# Patient Record
Sex: Female | Born: 1990 | Race: White | Hispanic: No | Marital: Single | Smoking: Never smoker
Health system: Southern US, Community
[De-identification: ages and names within clinical notes are randomized; demographics above are authoritative.]

## PROBLEM LIST (undated history)

## (undated) DIAGNOSIS — S060XAA Concussion with loss of consciousness status unknown, initial encounter: Secondary | ICD-10-CM

## (undated) DIAGNOSIS — D759 Disease of blood and blood-forming organs, unspecified: Secondary | ICD-10-CM

## (undated) DIAGNOSIS — S065XAA Traumatic subdural hemorrhage with loss of consciousness status unknown, initial encounter: Secondary | ICD-10-CM

## (undated) DIAGNOSIS — S065X9A Traumatic subdural hemorrhage with loss of consciousness of unspecified duration, initial encounter: Secondary | ICD-10-CM

## (undated) DIAGNOSIS — T7840XA Allergy, unspecified, initial encounter: Secondary | ICD-10-CM

## (undated) DIAGNOSIS — Z5189 Encounter for other specified aftercare: Secondary | ICD-10-CM

## (undated) DIAGNOSIS — D649 Anemia, unspecified: Secondary | ICD-10-CM

## (undated) DIAGNOSIS — B019 Varicella without complication: Secondary | ICD-10-CM

## (undated) DIAGNOSIS — S060X9A Concussion with loss of consciousness of unspecified duration, initial encounter: Secondary | ICD-10-CM

## (undated) DIAGNOSIS — N920 Excessive and frequent menstruation with regular cycle: Secondary | ICD-10-CM

## (undated) HISTORY — DX: Traumatic subdural hemorrhage with loss of consciousness status unknown, initial encounter: S06.5XAA

## (undated) HISTORY — DX: Allergy, unspecified, initial encounter: T78.40XA

## (undated) HISTORY — DX: Varicella without complication: B01.9

## (undated) HISTORY — PX: DILATION AND CURETTAGE OF UTERUS: SHX78

## (undated) HISTORY — DX: Disease of blood and blood-forming organs, unspecified: D75.9

## (undated) HISTORY — DX: Traumatic subdural hemorrhage with loss of consciousness of unspecified duration, initial encounter: S06.5X9A

## (undated) HISTORY — DX: Anemia, unspecified: D64.9

## (undated) HISTORY — DX: Concussion with loss of consciousness status unknown, initial encounter: S06.0XAA

## (undated) HISTORY — DX: Concussion with loss of consciousness of unspecified duration, initial encounter: S06.0X9A

## (undated) HISTORY — DX: Encounter for other specified aftercare: Z51.89

## (undated) HISTORY — DX: Excessive and frequent menstruation with regular cycle: N92.0

---
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
## 2012-07-19 ENCOUNTER — Telehealth: Payer: Self-pay

## 2012-07-19 MED ORDER — NORGESTIMATE-ETH ESTRADIOL 0.25-35 MG-MCG PO TABS: 1.0000 | ORAL_TABLET | Freq: Every day | ORAL | Status: DC

## 2012-07-19 NOTE — Telephone Encounter (Signed)
Spoke with pt rgd rs refill request pt has appt 08/02/12 9:45 with ND no missed pills informed rx sent to pharm per protocol pt voice understanding

## 2012-07-19 NOTE — Telephone Encounter (Signed)
Lm on vm tcb rgd rx refill request

## 2012-08-13 ENCOUNTER — Encounter: Payer: Self-pay | Admitting: Obstetrics and Gynecology

## 2012-08-14 ENCOUNTER — Other Ambulatory Visit: Payer: Self-pay | Admitting: Obstetrics and Gynecology

## 2012-08-16 ENCOUNTER — Ambulatory Visit: Payer: Self-pay | Admitting: Obstetrics and Gynecology

## 2012-09-12 ENCOUNTER — Other Ambulatory Visit: Payer: Self-pay | Admitting: Obstetrics and Gynecology

## 2012-10-12 ENCOUNTER — Ambulatory Visit: Payer: Self-pay | Admitting: Obstetrics and Gynecology

## 2012-10-18 ENCOUNTER — Ambulatory Visit: Payer: Self-pay | Admitting: Obstetrics and Gynecology

## 2012-10-30 ENCOUNTER — Other Ambulatory Visit: Payer: Self-pay | Admitting: Obstetrics and Gynecology

## 2014-09-18 ENCOUNTER — Other Ambulatory Visit: Payer: Self-pay

## 2014-09-18 ENCOUNTER — Other Ambulatory Visit (HOSPITAL_COMMUNITY)
Admission: RE | Admit: 2014-09-18 | Discharge: 2014-09-18 | Disposition: A | Discharge status: Home / Self Care | Payer: BC Managed Care – PPO | Source: Ambulatory Visit | Attending: Family Medicine | Admitting: Family Medicine

## 2014-09-18 DIAGNOSIS — Z0000 Encounter for general adult medical examination without abnormal findings: Secondary | ICD-10-CM | POA: Insufficient documentation

## 2014-09-20 LAB — CYTOLOGY - PAP

## 2015-09-01 ENCOUNTER — Emergency Department (HOSPITAL_COMMUNITY)
Admission: EM | Admit: 2015-09-01 | Discharge: 2015-09-01 | Disposition: A | Discharge status: Home / Self Care | Payer: No Typology Code available for payment source | Attending: Emergency Medicine | Admitting: Emergency Medicine

## 2015-09-01 ENCOUNTER — Emergency Department (HOSPITAL_COMMUNITY): Payer: No Typology Code available for payment source

## 2015-09-01 ENCOUNTER — Encounter (HOSPITAL_COMMUNITY): Payer: Self-pay | Admitting: Emergency Medicine

## 2015-09-01 DIAGNOSIS — Y998 Other external cause status: Secondary | ICD-10-CM | POA: Insufficient documentation

## 2015-09-01 DIAGNOSIS — Y92410 Unspecified street and highway as the place of occurrence of the external cause: Secondary | ICD-10-CM | POA: Insufficient documentation

## 2015-09-01 DIAGNOSIS — S199XXA Unspecified injury of neck, initial encounter: Secondary | ICD-10-CM | POA: Insufficient documentation

## 2015-09-01 DIAGNOSIS — Z862 Personal history of diseases of the blood and blood-forming organs and certain disorders involving the immune mechanism: Secondary | ICD-10-CM | POA: Diagnosis not present

## 2015-09-01 DIAGNOSIS — Z79899 Other long term (current) drug therapy: Secondary | ICD-10-CM | POA: Insufficient documentation

## 2015-09-01 DIAGNOSIS — S299XXA Unspecified injury of thorax, initial encounter: Secondary | ICD-10-CM | POA: Diagnosis not present

## 2015-09-01 DIAGNOSIS — M542 Cervicalgia: Secondary | ICD-10-CM

## 2015-09-01 DIAGNOSIS — Y9389 Activity, other specified: Secondary | ICD-10-CM | POA: Diagnosis not present

## 2015-09-01 MED ORDER — CYCLOBENZAPRINE HCL 10 MG PO TABS: 10.0000 mg | ORAL_TABLET | Freq: Two times a day (BID) | ORAL | Status: DC | PRN
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
## 2015-09-01 NOTE — ED Notes (Signed)
Patient in no acute distress.  Rates pa
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
## 2015-09-01 NOTE — Discharge Instructions (Signed)
-   Use flexeril for neck pain, may make you drowsy so take at night - OTC tylenol or advil for pain control - Return to ED with severe headache, fainting, dizziness, shortness of breath, weakness of extremities, unstable gait or further worsening of symptoms

## 2016-06-16 ENCOUNTER — Encounter: Payer: Self-pay | Admitting: Family Medicine

## 2016-06-16 ENCOUNTER — Ambulatory Visit (INDEPENDENT_AMBULATORY_CARE_PROVIDER_SITE_OTHER): Payer: Self-pay | Admitting: Family Medicine

## 2016-06-16 VITALS — BP 130/88 | HR 85 | Temp 98.4°F | Resp 20 | Ht 63.0 in | Wt 233.0 lb

## 2016-06-16 DIAGNOSIS — Z7189 Other specified counseling: Secondary | ICD-10-CM

## 2016-06-16 DIAGNOSIS — Z7689 Persons encountering health services in other specified circumstances: Secondary | ICD-10-CM

## 2016-06-16 DIAGNOSIS — N912 Amenorrhea, unspecified: Secondary | ICD-10-CM | POA: Insufficient documentation

## 2016-06-16 LAB — CBC WITH DIFFERENTIAL/PLATELET
BASOS PCT: 0.3 % (ref 0.0–3.0)
Basophils Absolute: 0 10*3/uL (ref 0.0–0.1)
EOS PCT: 3.1 % (ref 0.0–5.0)
Eosinophils Absolute: 0.3 10*3/uL (ref 0.0–0.7)
HEMATOCRIT: 44.3 % (ref 36.0–46.0)
HEMOGLOBIN: 15 g/dL (ref 12.0–15.0)
LYMPHS PCT: 37 % (ref 12.0–46.0)
Lymphs Abs: 3 10*3/uL (ref 0.7–4.0)
MCHC: 33.8 g/dL (ref 30.0–36.0)
MCV: 91.9 fl (ref 78.0–100.0)
Monocytes Absolute: 0.3 10*3/uL (ref 0.1–1.0)
Monocytes Relative: 4.1 % (ref 3.0–12.0)
Neutro Abs: 4.6 10*3/uL (ref 1.4–7.7)
Neutrophils Relative %: 55.5 % (ref 43.0–77.0)
Platelets: 330 10*3/uL (ref 150.0–400.0)
RBC: 4.82 Mil/uL (ref 3.87–5.11)
RDW: 12.8 % (ref 11.5–15.5)
WBC: 8.2 10*3/uL (ref 4.0–10.5)

## 2016-06-16 LAB — TSH: TSH: 0.89 u[IU]/mL (ref 0.35–4.50)

## 2016-06-16 LAB — POCT URINE PREGNANCY: Preg Test, Ur: NEGATIVE

## 2016-06-16 NOTE — Progress Notes (Signed)
Patient ID: Barbara Mcguire, female   DOB: 01-19-1991, 25 y.o.   MRN: [id number]      Patient ID: Barbara Mcguire, female  DOB: 10-19-1991, 25 y.o.   MRN: [id number]  Subjective:  Barbara Mcguire is a 25 y.o.  female present for establish care.  All past medical history, surgical history, allergies, family history, immunizations, medications and social history were obtained and entered in the electronic medical record today. All recent labs, ED visits and hospitalizations within the last year were reviewed.   Amenorrhea: Pt presents for new pt establishment with complaints of not having a menstrual cycle since 03/2016. She had been on [facility] for heavy periods and ran out in November 2016. She has normal periods off the BCP until April. She has a history of "factor deficiency" bleeding disorder, requiring D&C and blood transfusions in 2005. She does not know if she is a "carrier" or has a blood disorder for certain.She state she was never tested. Prior to BCP, periods were irregular as well as heavy. Patient states she is not sexually active. No vaginal discharge or irritation. Last PAP 2015 and normal. She gets occasional headaches with cycle otherwise no symptoms. From November to April she had regular ~30 day cycles, last ing ~5 days with moderate/normal  bleeding.   Health maintenance:  Colonoscopy: No Fhx, routine screen 49 Mammogram:No Fhx, routine screen 39 Cervical cancer screening: 2015 by prior PCP, normal.  Immunizations: unknown. Infectious disease screening: HIV indicated DEXA: No Fhx osteoporosis. Routine screen.    Past Medical History  Diagnosis Date  . Blood disorder     factor deficent  . Allergy   . Anemia   . Blood transfusion without reported diagnosis   . Chicken pox   . Concussion   . Subdural hematoma, post-traumatic (HCC)    No Known Allergies Past Surgical History  Procedure Laterality Date  . Dilation and curettage of uterus      in 9th grade with 2  transfuisions    Family History  Problem Relation Age of Onset  . Breast cancer Other   . HIV Father   . Pancreatic cancer Maternal Grandmother   . Arthritis Maternal Grandfather   . Diabetes Maternal Grandfather   . Heart disease Maternal Grandfather   . Heart disease Paternal Grandmother    Social History   Social History  . Marital Status: Single    Spouse Name: N/A  . Number of Children: N/A  . Years of Education: N/A   Occupational History  . Not on file.   Social History Main Topics  . Smoking status: Never Smoker   . Smokeless tobacco: Not on file  . Alcohol Use: No  . Drug Use: No  . Sexual Activity: No   Other Topics Concern  . Not on file   Social History Narrative   Single.    HS education. Works in Engineering geologist.    Drinks caffeine.    Wears seatbelt.   Exercises routinely.    Smoke detector in the home.    Firearms in the home.    Feels safe in her relationships.        ROS: 14 pt review of systems performed and negative (unless mentioned in an HPI)  Objective: BP 130/88 mmHg  Pulse 85  Temp(Src) 98.4 F (36.9 C)  Resp 20  Ht  (1.6 m)  Wt 233 lb (105.688 kg)  BMI 41.28 kg/m2  SpO2 95%  LMP 04/16/2016 Gen: Afebrile. No acute distress. Nontoxic in  appearance, well-developed, well-nourished, female, pleasant HENT: AT. [place].  MMM, no oral lesions Eyes:Pupils Equal Round Reactive to light, Extraocular movements intact,  Conjunctiva without redness, discharge or icterus. Neck/lymp/endocrine: Supple,no lymphadenopathy CV: RRR, no edema Chest: CTAB, no wheeze, rhonchi or crackles.  Abd: Soft. NTND. BS present. Skin:  Warm and well-perfused. Skin intact. Neuro/Msk: Normal gait. PERLA. EOMi. Alert. Oriented x3.   Psych: Normal affect, dress and demeanor. Normal speech. Normal thought content and judgment.   Assessment/plan: Barbara Mcguire is a 25 y.o. female present for establishment of care with complaints of amenorrhea.  Amenorrhea - Pt is  self pay. Will start with CBC and TSH. Discussed progesterone withdraw trial if labs are normal. She would like to stay off BCP if possible, but understands she may need to be placed back on them if periods do not regulate or become heavy.  - POCT urine pregnancy: negative - CBC w/Diff - TSH   Return if symptoms worsen or fail to improve. Greater than 30 minutes was spent with patient, greater than 50% of that time was spent face-to-face with patient counseling and coordinating care.  Electronically signed by: Felix Pacinienee Kuneff, DO [facility] [place]

## 2016-06-16 NOTE — Patient Instructions (Addendum)
It was a pleasure meeting you today.  Primary Amenorrhea Primary amenorrhea is the absence of any menstrual flow in a female by the age of 15 years. An average age for the start of menstruation is the age of 12 years. Primary amenorrhea is not considered to have occurred until a female is older than 15 years and has never menstruated. This may occur with or without other signs of puberty. CAUSES  Some common causes of not menstruating include:  Chromosomal abnormality causing the ovaries to malfunction is the most common cause of primary amenorrhea.  Malnutrition.  Low blood sugar (hypoglycemia).  Polycystic ovary syndrome (cysts in the ovaries, not ovulating).  Absence of the vagina, uterus, or ovaries since birth (congenital).  Extreme obesity.  Cystic fibrosis.  Drastic weight loss from any cause.  Over-exercising (running, biking) causing loss of body fat.  Pituitary gland tumor in the brain.  Long-term (chronic) illnesses.  Cushing disease.  Thyroid disease (hypothyroidism, hyperthyroidism).  Part of the brain (hypothalamus) not functioning normally.  Premature ovarian failure. SYMPTOMS  No menstruation by age 115 years in normally developed females is the primary symptom. Other symptoms may include:  Discharge from the breasts.  Hot flashes.  Adult acne.  Facial or chest hair.  Headaches.  Impaired vision.  Recent stress.  Changes in weight, diet, or exercise patterns. DIAGNOSIS  Primary amenorrhea is diagnosed with the help of a medical history and a physical exam. Other tests that may be recommended include:  Blood tests to check for pregnancy, hormonal changes, a bleeding or thyroid disorder, low iron levels (anemia), or other problems.  Urine tests.  Specialized X-ray exams. TREATMENT  Treatment will depend on the cause. For example, some of the causes of primary amenorrhea, such as congenital absence of sex organs, will require surgery to  correct. Others may respond to treatment with medicine. SEEK MEDICAL CARE IF:  There has not been any menstrual flow by age 26 years.  Body maturation does not occur at a level typical of peers.  Pelvic area pain occurs.  There is unusual weight gain or hair growth.   This information is not intended to replace advice given to you by your health care provider. Make sure you discuss any questions you have with your health care provider.   Document Released: 12/15/2005 Document Revised: 01/05/2015 Document Reviewed: 07/27/2013 Elsevier Interactive Patient Education [employer]2016 Elsevier Inc.

## 2016-06-17 ENCOUNTER — Telehealth: Payer: Self-pay | Admitting: Family Medicine

## 2016-06-17 DIAGNOSIS — N912 Amenorrhea, unspecified: Secondary | ICD-10-CM

## 2016-06-17 MED ORDER — MEDROXYPROGESTERONE ACETATE 10 MG PO TABS: 10.0000 mg | ORAL_TABLET | Freq: Every day | ORAL | Status: DC

## 2016-06-17 NOTE — Telephone Encounter (Signed)
Left message on patient voice mail with results and detailed instructions.

## 2016-06-17 NOTE — Telephone Encounter (Signed)
Please call pt: - her labs are normal. I have called in a progesterone course for 10 days. She should should see her cycle within a about a week after completing medication. If not we would need to consider additional work up vs. GYN referral.

## 2016-06-18 ENCOUNTER — Encounter: Payer: Self-pay | Admitting: Family Medicine

## 2016-06-30 ENCOUNTER — Encounter: Payer: Self-pay | Admitting: Family Medicine

## 2016-07-07 ENCOUNTER — Telehealth: Payer: Self-pay | Admitting: *Deleted

## 2016-07-07 MED ORDER — NORGESTIMATE-ETH ESTRADIOL 0.25-35 MG-MCG PO TABS: 1.0000 | ORAL_TABLET | Freq: Every day | ORAL | Status: DC

## 2016-07-07 NOTE — Telephone Encounter (Signed)
Patient called she states she did have a menstrual cycle and would like to know if she can restart the birth control pills? Please advise.

## 2016-07-07 NOTE — Telephone Encounter (Signed)
Spoke with patient she does want to restart her birth control pills. Rx sent to pharmacy. Patient will call back to schedule CPE with Pap for sometime in October.

## 2016-07-07 NOTE — Telephone Encounter (Signed)
Called patient lost connection will try back again later.

## 2016-07-07 NOTE — Telephone Encounter (Signed)
She can restart bcp if she desires. She had stated she did not desire bcp at her appt. I can call in the bcp she was on prior for 9 months. She will be due for PAP when that script runs out. (if she desire please order for 9 months-listed on current medications) - She is due for CPE in October, we could also perform her PAP at that time. Please encouraged her to schedule.

## 2016-12-24 ENCOUNTER — Other Ambulatory Visit: Payer: Self-pay | Admitting: Family Medicine

## 2017-01-16 ENCOUNTER — Other Ambulatory Visit: Payer: Self-pay | Admitting: Family Medicine
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
## 2017-06-24 ENCOUNTER — Other Ambulatory Visit (HOSPITAL_COMMUNITY)
Admission: RE | Admit: 2017-06-24 | Discharge: 2017-06-24 | Disposition: A | Discharge status: Home / Self Care | Payer: Self-pay | Source: Ambulatory Visit | Attending: Family Medicine | Admitting: Family Medicine

## 2017-06-24 ENCOUNTER — Encounter: Payer: Self-pay | Admitting: Family Medicine

## 2017-06-24 ENCOUNTER — Ambulatory Visit (INDEPENDENT_AMBULATORY_CARE_PROVIDER_SITE_OTHER): Payer: Self-pay | Admitting: Family Medicine

## 2017-06-24 VITALS — BP 128/87 | HR 66 | Temp 98.5°F | Resp 18 | Ht 63.0 in | Wt 224.5 lb

## 2017-06-24 DIAGNOSIS — Z01419 Encounter for gynecological examination (general) (routine) without abnormal findings: Secondary | ICD-10-CM | POA: Insufficient documentation

## 2017-06-24 DIAGNOSIS — Z3041 Encounter for surveillance of contraceptive pills: Secondary | ICD-10-CM

## 2017-06-24 DIAGNOSIS — Z124 Encounter for screening for malignant neoplasm of cervix: Secondary | ICD-10-CM

## 2017-06-24 MED ORDER — NORGESTIMATE-ETH ESTRADIOL 0.25-35 MG-MCG PO TABS: 1.0000 | ORAL_TABLET | Freq: Every day | ORAL | 11 refills | Status: DC

## 2017-06-24 NOTE — Progress Notes (Signed)
Patient ID: Barbara Mcguire, female   DOB: 14-Oct-1991, 26 y.o.   MRN: [id number]      Patient ID: Barbara Mcguire, female  DOB: 14-Oct-1991, 26 y.o.   MRN: [id number]  Subjective:  Barbara Mcguire is a 26 y.o.  female present for  Family planning, BCP (birth control pills) maintenance with routine gynecological exam with PAP.  Pt denies vaginal discharge or irritation. She is currently not sexually active. Her cycles have been monthly about 3-4 days in length and light. Her last PAP was 08/2014 and normal, no co-test.  Patient's last menstrual period was 06/05/2017. Mammogram:No Fhx, routine screen 40. HIV declined, pt is self pay.  Tdap UTD.  Past Medical History:  Diagnosis Date  . Allergy   . Anemia   . 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 Blood transfusion without reported diagnosis   . Chicken pox   . Concussion   . Menorrhagia   . Subdural hematoma, post-traumatic (HCC) left   head injury   No Known Allergies Past Surgical History:  Procedure Laterality Date  . DILATION AND CURETTAGE OF UTERUS     in 9th grade with 2 transfuisions    Family History  Problem Relation Age of Onset  . Breast cancer Other   . Asthma Mother   . Hypertension Mother   . Ovarian cancer Mother   . HIV Father   . Hemophilia Father   . Pancreatic cancer Maternal Grandmother   . Arthritis Maternal Grandfather   . Heart disease Maternal Grandfather   . Diabetes Paternal Grandfather   . Von Willebrand disease Paternal Grandfather   . Heart disease Paternal Grandmother    Social History   Social History  . Marital status: Single    Spouse name: N/A  . Number of children: N/A  . Years of education: N/A   Occupational History  . Not on file.   Social History Main Topics  . Smoking status: Never Smoker  . Smokeless tobacco: Never Used  . Alcohol use No  . Drug use: No  . Sexual activity: No   Other Topics Concern  . Not on file   Social  History Narrative   Single.    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    Drinks caffeine.    Wears seatbelt.   Exercises routinely.    Smoke detector in the home.    Firearms in the home.    Feels safe in her relationships.        ROS: 14 pt review of systems performed and negative (unless mentioned in an HPI)  Objective: BP 128/87 (BP Location: Right Arm, Patient Position: Sitting, Cuff Size: Large)   Pulse 66   Temp 98.5 F (36.9 C)   Resp 18   Ht 5\' 3"  (1.6 m)   Wt 224 lb 8 oz (101.8 kg)   LMP 06/05/2017   SpO2 99%   BMI 39.77 kg/m  Gen: Afebrile. No acute distress. pleasant obese caucasian female.  HENT: AT. [place].  MMM.  Eyes:Pupils Equal Round Reactive to light, Extraocular movements intact,  Conjunctiva without redness, discharge or icterus. CV: RRR no murmur, no edema Chest: CTAB, no wheeze or crackles Abd: Soft. obese. NTND. BS present. no Masses palpated.  Skin: no rashes, purpura or petechiae.  Neuro:  Normal gait. PERLA. EOMi. Alert. Oriented x3.  Psych: Normal affect, dress and demeanor. Normal speech. Normal thought content and judgment. Breasts: breasts appear normal,  symmetrical, no tenderness on exam, no suspicious masses, no skin or nipple changes or axillary nodes. GYN:  External genitalia with mild erythema/irritation, otherwise within normal limits, normal hair distribution, no lesions. Urethral meatus normal, no lesions. Vaginal mucosa pink, moist, normal rugae, no lesions. No cystocele or rectocele. cervix without lesions, mildly friable, discharge and mild odor present. Bimanual exam revealed normal uterus.  No bladder/suprapubic fullness, masses or tenderness. No cervical motion tenderness. No adnexal fullness. Anus and perineum within normal limits, no lesions. Very difficult pelvic exam. Pt has anxiety surrounding exam.   Assessment/plan: Barbara Mcguire is a 26 y.o. female present for  Family planning, BCP (birth control pills) maintenance -  norgestimate-ethinyl estradiol (MONONESSA) 0.25-35 MG-MCG tablet; Take 1 tablet by mouth daily.  Dispense: 1 Package; Refill: 11 Cervical smear, as part of routine gynecological examination Screening for cervical cancer - Cytology - PAP, if normal follow up in 3 years for pap repeat.  - consider flagyl script if indicated by results.   Return in about 1 year (around 06/24/2018).   Electronically signed by: Felix Pacini, DO [facility] [place]

## 2017-06-24 NOTE — Patient Instructions (Addendum)
It was nice to see you today.  I have refilled your birth control pills for you.   Follow yearly, if your PAP is normal next pap will be in 3 years.  Please help [place] help you:  We are honored you have chosen [facility] for your Primary Care home. Below you will find basic instructions that you may need to access in the future. Please help [place] help you by reading the instructions, which cover many of the frequent questions we experience.   Prescription refills and request:  -In order to allow more efficient response time, please call your pharmacy for all refills. They will forward the request electronically to [place]. This allows for the quickest possible response. Request left on a nurse line can take longer to refill, since these are checked as time allows between office patients and other phone calls.  - refill request can take up to 3-5 working days to complete.  - If request is sent electronically and request is appropiate, it is usually completed in 1-2 business days.  - all patients will need to be seen routinely for all chronic medical conditions requiring prescription medications (see follow-up below). If you are overdue for follow up on your condition, you will be asked to make an appointment and we will call in enough medication to cover you until your appointment (up to 30 days).  - all controlled substances will require a face to face visit to request/refill.  - if you desire your prescriptions to go through a new pharmacy, and have an active script at original pharmacy, you will need to call your pharmacy and have scripts transferred to new pharmacy. This is completed between the pharmacy locations and not by your provider.    Results: If any images or labs were ordered, it can take up to 1 week to get results depending on the test ordered and the lab/facility running and resulting the test. - Normal or stable results, which do not need further discussion, may be released to your  mychart immediately with attached note to you. A call may not be generated for normal results. Please make certain to sign up for mychart. If you have questions on how to activate your mychart you can call the front office.  - If your results need further discussion, our office will attempt to contact you via phone, and if unable to reach you after 2 attempts, we will release your abnormal result to your mychart with instructions.  - All results will be automatically released in mychart after 1 week.  - Your provider will provide you with explanation and instruction on all relevant material in your results. Please keep in mind, results and labs may appear confusing or abnormal to the untrained eye, but it does not mean they are actually abnormal for you personally. If you have any questions about your results that are not covered, or you desire more detailed explanation than what was provided, you should make an appointment with your provider to do so.   Our office handles many outgoing and incoming calls daily. If we have not contacted you within 1 week about your results, please check your mychart to see if there is a message first and if not, then contact our office.  In helping with this matter, you help decrease call volume, and therefore allow [place] to be able to respond to patients needs more efficiently.   Acute office visits (sick visit):  An acute visit is intended for a  new problem and are scheduled in shorter time slots to allow schedule openings for patients with new problems. This is the appropriate visit to discuss a new problem. In order to provide you with excellent quality medical care with proper time for you to explain your problem, have an exam and receive treatment with instructions, these appointments should be limited to one new problem per visit. If you experience a new problem, in which you desire to be addressed, please make an acute office visit, we save openings on the schedule to  accommodate you. Please do not save your new problem for any other type of visit, let us take care of it properly and quickly for you.   Follow up visits:  Depending on your condition(s) your provider will need to see you routinely in order to provide you with quality care and prescribe medication(s). Most chronic conditions (Example: hypertension, Diabetes, depression/anxiety... etc), require visits a couple times a year. Your provider will instruct you on proper follow up for your personal medical conditions and history. Please make certain to make follow up appointments for your condition as instructed. Failing to do so could result in lapse in your medication treatment/refills. If you request a refill, and are overdue to be seen on a condition, we will always provide you with a 30 day script (once) to allow you time to schedule.    Medicare wellness (well visit): - we have a wonderful Nurse Maudie Mercury), that will meet with you and provide you will yearly medicare wellness visits. These visits should occur yearly (can not be scheduled less than 1 calendar year apart) and cover preventive health, immunizations, advance directives and screenings you are entitled to yearly through your medicare benefits. Do not miss out on your entitled benefits, this is when medicare will pay for these benefits to be ordered for you.  These are strongly encouraged by your provider and is the appropriate type of visit to make certain you are up to date with all preventive health benefits. If you have not had your medicare wellness exam in the last 12 months, please make certain to schedule one by calling the office and schedule your medicare wellness with Maudie Mercury as soon as possible.   Yearly physical (well visit):  - Adults are recommended to be seen yearly for physicals. Check with your insurance and date of your last physical, most insurances require one calendar year between physicals. Physicals include all preventive health  topics, screenings, medical exam and labs that are appropriate for gender/age and history. You may have fasting labs needed at this visit. This is a well visit (not a sick visit), new problems should not be covered during this visit (see acute visit).  - Pediatric patients are seen more frequently when they are younger. Your provider will advise you on well child visit timing that is appropriate for your their age. - This is not a medicare wellness visit. Medicare wellness exams do not have an exam portion to the visit. Some medicare companies allow for a physical, some do not allow a yearly physical. If your medicare allows a yearly physical you can schedule the medicare wellness with our nurse Maudie Mercury and have your physical with your provider after, on the same day. Please check with insurance for your full benefits.   Late Policy/No Shows:  - all new patients should arrive 15-30 minutes earlier than appointment to allow [place] time  to  obtain all personal demographics,  insurance information and for you to complete  office paperwork. - All established patients should arrive 10-15 minutes earlier than appointment time to update all information and be checked in .  - In our best efforts to run on time, if you are late for your appointment you will be asked to either reschedule or if able, we will work you back into the schedule. There will be a wait time to work you back in the schedule,  depending on availability.  - If you are unable to make it to your appointment as scheduled, please call 24 hours ahead of time to allow us to fill the time slot with someone else who needs to be seen. If you do not cancel your appointment ahead of time, you may be charged a no show fee.

## 2017-06-26 ENCOUNTER — Telehealth: Payer: Self-pay | Admitting: Family Medicine

## 2017-06-26 LAB — CYTOLOGY - PAP: DIAGNOSIS: NEGATIVE

## 2017-06-26 NOTE — Telephone Encounter (Signed)
Tried to call patient unable to leave message.

## 2017-06-26 NOTE — Telephone Encounter (Signed)
Please call pt: - pap is normal. Rpt 3 years

## 2017-06-30 NOTE — Telephone Encounter (Signed)
Tried to call patient unable to leave message.

## 2017-07-06 ENCOUNTER — Encounter: Payer: Self-pay | Admitting: *Deleted

## 2017-07-06 NOTE — Telephone Encounter (Signed)
Sent results in MY Chart. 

## 2018-06-04 ENCOUNTER — Other Ambulatory Visit: Payer: Self-pay | Admitting: Family Medicine

## 2018-06-04 DIAGNOSIS — Z3041 Encounter for surveillance of contraceptive pills: Secondary | ICD-10-CM

## 2018-06-09 ENCOUNTER — Ambulatory Visit (INDEPENDENT_AMBULATORY_CARE_PROVIDER_SITE_OTHER): Payer: Self-pay | Admitting: Family Medicine

## 2018-06-09 ENCOUNTER — Encounter: Payer: Self-pay | Admitting: Family Medicine

## 2018-06-09 VITALS — BP 128/85 | HR 66 | Temp 98.3°F | Resp 20 | Ht 63.0 in | Wt 241.2 lb

## 2018-06-09 DIAGNOSIS — Z3041 Encounter for surveillance of contraceptive pills: Secondary | ICD-10-CM

## 2018-06-09 MED ORDER — NORGESTIMATE-ETH ESTRADIOL 0.25-35 MG-MCG PO TABS: 1.0000 | ORAL_TABLET | Freq: Every day | ORAL | 11 refills | Status: DC

## 2018-06-09 NOTE — Progress Notes (Signed)
Patient ID: Barbara Mcguire, female   DOB: [birth year], 27 y.o.   MRN: [id number]      Patient ID: Barbara Mcguire, female  DOB: [birth year], 28 y.o.   MRN: [id number]  Chief Complaint  Patient presents with  . Contraception    refill BCP    Subjective:  Barbara Mcguire is a 27 y.o.  female present for  Family planning, BCP (birth control pills) maintenance : t denies vaginal discharge or irritation. She is currently not sexually active. Her cycles have been monthly about 3-4 days in length and light. Her last PAP was 08/2014 and normal, no co-test.  Patient's last menstrual period was 06/07/2018. Mammogram:No Fhx, routine screen 40. HIV declined, pt is self pay.  Tdap UTD.  Past Medical History:  Diagnosis Date  . Allergy   . Anemia   . 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 Blood transfusion without reported diagnosis   . Chicken pox   . Concussion   . Menorrhagia   . Subdural hematoma, post-traumatic (HCC) left   head injury   No Known Allergies Past Surgical History:  Procedure Laterality Date  . DILATION AND CURETTAGE OF UTERUS     in 9th grade with 2 transfuisions    Family History  Problem Relation Age of Onset  . Breast cancer Other   . Asthma Mother   . Hypertension Mother   . Ovarian cancer Mother   . HIV Father   . Hemophilia Father   . Pancreatic cancer Maternal Grandmother   . Arthritis Maternal Grandfather   . Heart disease Maternal Grandfather   . Diabetes Paternal Grandfather   . Von Willebrand disease Paternal Grandfather   . Heart disease Paternal Grandmother    Social History   Socioeconomic History  . Marital status: Single    Spouse name: Not on file  . Number of children: Not on file  . Years of education: Not on file  . Highest education level: Not on file  Occupational History  . Not on file  Social Needs  . Financial resource strain: Not on file  . Food insecurity:    Worry: Not on file   Inability: Not on file  . Transportation needs:    Medical: Not on file    Non-medical: Not on file  Tobacco Use  . Smoking status: Never Smoker  . Smokeless tobacco: Never Used  Substance and Sexual Activity  . Alcohol use: No  . Drug use: No  . Sexual activity: Never    Birth control/protection: Pill  Lifestyle  . Physical activity:    Days per week: Not on file    Minutes per session: Not on file  . Stress: Not on file  Relationships  . Social connections:    Talks on phone: Not on file    Gets together: Not on file    Attends religious service: Not on file    Active member of club or organization: Not on file    Attends meetings of clubs or organizations: Not on file    Relationship status: Not on file  . Intimate partner violence:    Fear of current or ex partner: Not on file    Emotionally abused: Not on file    Physically abused: Not on file    Forced sexual activity: Not on file  Other Topics Concern  . Not on file  Social History Narrative   Single.  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    Drinks caffeine.    Wears seatbelt.   Exercises routinely.    Smoke detector in the home.    Firearms in the home.    Feels safe in her relationships.     ROS: 14 pt review of systems performed and negative (unless mentioned in an HPI)  Objective: BP 128/85 (BP Location: Left Arm, Patient Position: Sitting, Cuff Size: Large)   Pulse 66   Temp 98.3 F (36.8 C)   Resp 20   Ht 5\' 3"  (1.6 m)   Wt 241 lb 4 oz (109.4 kg)   LMP 06/07/2018   SpO2 97%   BMI 42.74 kg/m  Gen: Afebrile. No acute distress. Nontoxic in appearance, well developed, well nourished, obese female.  HENT: AT. [place].  MMM.  Eyes:Pupils Equal Round Reactive to light, Extraocular movements intact,  Conjunctiva without redness, discharge or icterus. Neck/lymp/endocrine: Supple,no lymphadenopathy, no thyromegaly CV: RRR no murmur, no edema, +2/4 P posterior tibialis pulses Chest: CTAB, no wheeze or  crackles Abd: Soft. obese. NTND. BS present.  Skin: no rashes, purpura or petechiae.  Neuro:  Normal gait. PERLA. EOMi. Alert. Oriented x3  Psych: Normal affect, dress and demeanor. Normal speech. Normal thought content and judgment..    Assessment/plan: Barbara Mcguire is a 27 y.o. female present for  Family planning, BCP (birth control pills) maintenance - doing well. She is self pay so declines labs. Advised her if she het s insurance and/or decides she would like to have a CPE, then BCP refill and CPE can be the same appt yearly. - pap UTD, due 05/2020 - norgestimate-ethinyl estradiol (MONONESSA) 0.25-35 MG-MCG tablet; Take 1 tablet by mouth daily.  Dispense: 1 Package; Refill: 11   Return in about 1 year (around 06/10/2019) for CPE.   Electronically signed by: Felix Pacinienee Caster [place], DO [facility]

## 2018-06-09 NOTE — Patient Instructions (Addendum)
It was great to see you today.  I have refilled your BCP. Your PAP is due June 2021.  BP looks great.  Follow up yearly for refills on BCP, sooner if needed. Your BCP refill can be a physical appt if you get insurance.   Please help us help you:  We are honored [facility]. Below you will find basic instructions that you may need to access in the future. Please help us help you by reading the instructions, which cover many of the frequent questions we experience.   Prescription refills and request:  -In order to allow more efficient response time, please call your pharmacy for all refills. They will forward the request electronically to us. This allows for the quickest possible response. Request left on a nurse line can take longer to refill, since these are checked as time allows between office patients and other phone calls.  - refill request can take up to 3-5 working days to complete.  - If request is sent electronically and request is appropiate, it is usually completed in 1-2 business days.  - all patients will need to be seen routinely for all chronic medical conditions requiring prescription medications (see follow-up below). If you are overdue for follow up on your condition, you will be asked to make an appointment and we will call in enough medication to cover you until your appointment (up to 30 days).  - all controlled substances will require a face to face visit to request/refill.  - if you desire your prescriptions to go through a new pharmacy, and have an active script at original pharmacy, you will need to call your pharmacy and have scripts transferred to new pharmacy. This is completed between the pharmacy locations and not by your provider.    Results: If any images or labs were ordered, it can take up to 1 week to get results depending on the test ordered and the lab/facility running and resulting the test. - Normal or stable  results, which do not need further discussion, may be released to your mychart immediately with attached note to you. A call may not be generated for normal results. Please make certain to sign up for mychart. If you have questions on how to activate your mychart you can call the front office.  - If your results need further discussion, our office will attempt to contact you via phone, and if unable to reach you after 2 attempts, we will release your abnormal result to your mychart with instructions.  - All results will be automatically released in mychart after 1 week.  - Your provider will provide you with explanation and instruction on all relevant material in your results. Please keep in mind, results and labs may appear confusing or abnormal to the untrained eye, but it does not mean they are actually abnormal for you personally. If you have any questions about your results that are not covered, or you desire more detailed explanation than what was provided, you should make an appointment with your provider to do so.   Our office handles many outgoing and incoming calls daily. If we have not contacted you within 1 week about your results, please check your mychart to see if there is a message first and if not, then contact our office.  In helping with this matter, you help decrease call volume, and therefore allow us to be able to respond to patients needs more efficiently.  Acute office visits (sick visit):  An acute visit is intended for a new problem and are scheduled in shorter time slots to allow schedule openings for patients with new problems. This is the appropriate visit to discuss a new problem. Problems will not be addressed by phone call or Echart message. Appointment is needed if requesting treatment. In order to provide you with excellent quality medical care with proper time for you to explain your problem, have an exam and receive treatment with instructions, these appointments should  be limited to one new problem per visit. If you experience a new problem, in which you desire to be addressed, please make an acute office visit, we save openings on the schedule to accommodate you. Please do not save your new problem for any other type of visit, let us take care of it properly and quickly for you.   Follow up visits:  Depending on your condition(s) your provider will need to see you routinely in order to provide you with quality care and prescribe medication(s). Most chronic conditions (Example: hypertension, Diabetes, depression/anxiety... etc), require visits a couple times a year. Your provider will instruct you on proper follow up for your personal medical conditions and history. Please make certain to make follow up appointments for your condition as instructed. Failing to do so could result in lapse in your medication treatment/refills. If you request a refill, and are overdue to be seen on a condition, we will always provide you with a 30 day script (once) to allow you time to schedule.    Medicare wellness (well visit): - we have a wonderful Nurse Maudie Mercury), that will meet with you and provide you will yearly medicare wellness visits. These visits should occur yearly (can not be scheduled less than 1 calendar year apart) and cover preventive health, immunizations, advance directives and screenings you are entitled to yearly through your medicare benefits. Do not miss out on your entitled benefits, this is when medicare will pay for these benefits to be ordered for you.  These are strongly encouraged by your provider and is the appropriate type of visit to make certain you are up to date with all preventive health benefits. If you have not had your medicare wellness exam in the last 12 months, please make certain to schedule one by calling the office and schedule your medicare wellness with Maudie Mercury as soon as possible.   Yearly physical (well visit):  - Adults are recommended to be seen  yearly for physicals. Check with your insurance and date of your last physical, most insurances require one calendar year between physicals. Physicals include all preventive health topics, screenings, medical exam and labs that are appropriate for gender/age and history. You may have fasting labs needed at this visit. This is a well visit (not a sick visit), new problems should not be covered during this visit (see acute visit).  - Pediatric patients are seen more frequently when they are younger. Your provider will advise you on well child visit timing that is appropriate for your their age. - This is not a medicare wellness visit. Medicare wellness exams do not have an exam portion to the visit. Some medicare companies allow for a physical, some do not allow a yearly physical. If your medicare allows a yearly physical you can schedule the medicare wellness with our nurse Maudie Mercury and have your physical with your provider after, on the same day. Please check with insurance for your full benefits.   Late Policy/No Shows:  -  all new patients should arrive 15-30 minutes earlier than appointment to allow [place] time  to  obtain all personal demographics,  insurance information and for you to complete office paperwork. - All established patients should arrive 10-15 minutes earlier than appointment time to update all information and be checked in .  - In our best efforts to run on time, if you are late for your appointment you will be asked to either reschedule or if able, we will work you back into the schedule. There will be a wait time to work you back in the schedule,  depending on availability.  - If you are unable to make it to your appointment as scheduled, please call 24 hours ahead of time to allow [place] to fill the time slot with someone else who needs to be seen. If you do not cancel your appointment ahead of time, you may be charged a no show fee.

## 2019-05-29 ENCOUNTER — Other Ambulatory Visit: Payer: Self-pay | Admitting: Family Medicine

## 2019-05-29 DIAGNOSIS — Z3041 Encounter for surveillance of contraceptive pills: Secondary | ICD-10-CM

## 2019-05-30 NOTE — Telephone Encounter (Signed)
LM for patient and Mychart message has been sent.

## 2019-05-30 NOTE — Telephone Encounter (Signed)
Please inform patient the following information: We have received a refill request for her BCP. I have refilled for 1 month- she is due for her yearly follow up.   In the past she did not have insurance- if she is still uninsured and can not afford an appt- please advise her of [facility] practice and the Health department take patients without insurance and provide them assistance.

## 2019-11-18 ENCOUNTER — Other Ambulatory Visit: Payer: Self-pay

## 2019-11-18 ENCOUNTER — Encounter: Payer: Self-pay | Admitting: Family Medicine

## 2019-11-18 ENCOUNTER — Ambulatory Visit (INDEPENDENT_AMBULATORY_CARE_PROVIDER_SITE_OTHER): Payer: Self-pay | Admitting: Family Medicine

## 2019-11-18 VITALS — Temp 98.6°F | Wt 255.0 lb

## 2019-11-18 DIAGNOSIS — Z3041 Encounter for surveillance of contraceptive pills: Secondary | ICD-10-CM

## 2019-11-18 MED ORDER — NORGESTIMATE-ETH ESTRADIOL 0.25-35 MG-MCG PO TABS: 1.0000 | ORAL_TABLET | Freq: Every day | ORAL | 11 refills | Status: DC

## 2019-11-18 NOTE — Progress Notes (Signed)
   VIRTUAL VISIT VIA VIDEO  I connected with Barbara Mcguire on 11/18/19 at  2:30 PM EST by a video enabled telemedicine application and verified that I am speaking with the correct person using two identifiers. Location patient: Home Location provider: [facility], Office Persons participating in the virtual visit: Patient, Dr. Raoul Pitch and R.Baker, LPN  I discussed the limitations of evaluation and management by telemedicine and the availability of in person appointments. The patient expressed understanding and agreed to proceed.   SUBJECTIVE Chief Complaint  Patient presents with  . Medication Refill    BC    HPI: Barbara Mcguire is a 28 y.o. female present to day for refills on BCP.  Family planning, BCP (birth control pills) maintenance : G0P0 female.  Patient denies any vaginal discharge or irritation.  She is not been sexually active.  Her cycles have changed over the last few weeks and she is had some breakthrough bleeding between her cycles.  She has not been on the birth control pill since around May or June.  Last PAP was 08/2017 and normal, no co-test.  Patient's last menstrual period- has been irregular with break through bleeding last month.  ROS: See pertinent positives and negatives per HPI.  Patient Active Problem List   Diagnosis Date Noted  . Family planning, BCP (birth control pills) maintenance 06/24/2017  . Cervical smear, as part of routine gynecological examination 06/24/2017    Social History   Tobacco Use  . Smoking status: Never Smoker  . Smokeless tobacco: Never Used  Substance Use Topics  . Alcohol use: No    Current Outpatient Medications:  .  norgestimate-ethinyl estradiol (ESTARYLLA) 0.25-35 MG-MCG tablet, Take 1 tablet by mouth daily., Disp: 28 tablet, Rfl: 0  No Known Allergies  OBJECTIVE: There were no vitals taken for this visit.  Gen: No acute distress. Nontoxic in appearance.  HENT: AT. [place].  MMM.  Eyes:Pupils Equal Round Reactive  to light, Extraocular movements intact,  Conjunctiva without redness, discharge or icterus. Chest: Cough or shortness of breath not present Neuro:  Alert. Oriented x3  Psych: Normal affect, dress and demeanor. Normal speech. Normal thought content and judgment.  ASSESSMENT AND PLAN: Barbara Mcguire is a 28 y.o. female present for  Family planning, BCP (birth control pills) maintenance -Patient's menstrual cycles are normal when on the birth control pill.  We will restart the birth control pill for her today.  She will monitor her cycles over the next 3 months and if still abnormal on the pill, she will need a face-to-face visit so we can discuss and perform lab work. - norgestimate-ethinyl estradiol (ESTARYLLA) 0.25-35 MG-MCG tablet; Take 1 tablet by mouth daily.  Dispense: 1 Package; Refill: 11 Follow-up in 3 months if cycles are still not controlled, otherwise yearly follow-up.   > 15 minutes spent with patient, > 50% of that time face to face   Howard Pouch, DO 11/18/2019

## 2020-10-01 ENCOUNTER — Other Ambulatory Visit (HOSPITAL_COMMUNITY)
Admission: RE | Admit: 2020-10-01 | Discharge: 2020-10-01 | Disposition: A | Discharge status: Home / Self Care | Payer: Self-pay | Source: Ambulatory Visit | Attending: Family Medicine | Admitting: Family Medicine

## 2020-10-01 ENCOUNTER — Other Ambulatory Visit: Payer: Self-pay

## 2020-10-01 ENCOUNTER — Encounter: Payer: Self-pay | Admitting: Family Medicine

## 2020-10-01 ENCOUNTER — Ambulatory Visit (INDEPENDENT_AMBULATORY_CARE_PROVIDER_SITE_OTHER): Payer: Self-pay | Admitting: Family Medicine

## 2020-10-01 VITALS — BP 137/86 | HR 88 | Temp 99.1°F | Ht 62.0 in | Wt 260.0 lb

## 2020-10-01 DIAGNOSIS — Z01419 Encounter for gynecological examination (general) (routine) without abnormal findings: Secondary | ICD-10-CM

## 2020-10-01 DIAGNOSIS — Z3041 Encounter for surveillance of contraceptive pills: Secondary | ICD-10-CM

## 2020-10-01 MED ORDER — NORGESTIMATE-ETH ESTRADIOL 0.25-35 MG-MCG PO TABS: 1.0000 | ORAL_TABLET | Freq: Every day | ORAL | 12 refills | Status: DC

## 2020-10-01 MED ORDER — FLUCONAZOLE 150 MG PO TABS: 150.0000 mg | ORAL_TABLET | Freq: Once | ORAL | 0 refills | Status: AC

## 2020-10-01 NOTE — Progress Notes (Signed)
     SUBJECTIVE Chief Complaint  Patient presents with  . Gynecologic Exam    HPI: Barbara Mcguire is a 29 y.o. female present to day for refills on BCP and Pap smear.  Family planning, BCP (birth control pills) maintenance : G0P0 female.  Patient reports mild vaginal irritation after her last menstrual cycle.  Menstrual cycles are routine with use of birth control pill. She is not been sexually active currently.  Last PAP was 08/2017 and normal, no co-test.   ROS: See pertinent positives and negatives per HPI.  Patient Active Problem List   Diagnosis Date Noted  . Family planning, BCP (birth control pills) maintenance 06/24/2017  . Cervical smear, as part of routine gynecological examination 06/24/2017    Social History   Tobacco Use  . Smoking status: Never Smoker  . Smokeless tobacco: Never Used  Substance Use Topics  . Alcohol use: No    Current Outpatient Medications:  .  cetirizine (ZYRTEC) 10 MG tablet, Take 10 mg by mouth daily., Disp: , Rfl:  .  norgestimate-ethinyl estradiol (ESTARYLLA) 0.25-35 MG-MCG tablet, Take 1 tablet by mouth daily., Disp: 30 tablet, Rfl: 12 .  fluconazole (DIFLUCAN) 150 MG tablet, Take 1 tablet (150 mg total) by mouth once for 1 dose., Disp: 1 tablet, Rfl: 0  No Known Allergies  OBJECTIVE: BP 137/86   Pulse 88   Temp 99.1 F (37.3 C) (Oral)   Ht 5\' 2"  (1.575 m)   Wt 260 lb (117.9 kg)   SpO2 98%   BMI 47.55 kg/m   Gen: Afebrile. No acute distress.  Very pleasant Caucasian female. HENT: AT. [place].  Eyes:Pupils Equal Round Reactive to light, Extraocular movements intact,  Conjunctiva without redness, discharge or icterus. CV: RRR  Chest: CTAB, no wheeze or crackles Abd: Soft.  NTND. BS present Neuro: Normal gait. PERLA. EOMi. Alert. Oriented x3.  Psych: Normal affect, dress and demeanor. Normal speech. Normal thought content and judgment..  Breasts: breasts appear normal, symmetrical, no tenderness on exam, no suspicious masses, no skin  or nipple changes or axillary nodes. GYN:  External genitalia within normal limits, normal hair distribution, no lesions. Urethral meatus normal, no lesions. Vaginal mucosa pink, moist, normal rugae, no lesions. No cystocele or rectocele. cervix without lesions, no discharge. Bimanual exam revealed normal uterus.  No bladder/suprapubic fullness, masses or tenderness. No cervical motion tenderness. No adnexal fullness. Anus and perineum within normal limits, no lesions.   ASSESSMENT AND PLAN: Barbara Mcguire is a 29 y.o. female present for  Family planning, BCP (birth control pills) maintenance/routine pelvic exam with Pap -Pap completed today with cotesting.  Patient will be called with those results.  She understands if normal would repeat Pap in 3-5 years. -Refills provided on norgestimate-ethinyl estradiol 0.2 5-3 5 mg-micrograms tablet. Patient will be called with Pap results. Follow-up yearly for birth control refills and/or CPE patient's choice.  No orders of the defined types were placed in this encounter.   Meds ordered this encounter  Medications  . norgestimate-ethinyl estradiol (ESTARYLLA) 0.25-35 MG-MCG tablet    Sig: Take 1 tablet by mouth daily.    Dispense:  30 tablet    Refill:  12    No additional refills.  . fluconazole (DIFLUCAN) 150 MG tablet    Sig: Take 1 tablet (150 mg total) by mouth once for 1 dose.    Dispense:  1 tablet    Refill:  0      Ginamarie Banfield, DO 10/01/2020

## 2020-10-01 NOTE — Patient Instructions (Signed)
I have refilled your pills for you.  I will call you with pap results.

## 2020-10-02 ENCOUNTER — Telehealth: Payer: Self-pay

## 2020-10-02 LAB — CYTOLOGY - PAP
Comment: NEGATIVE
Diagnosis: NEGATIVE
High risk HPV: NEGATIVE

## 2020-10-02 MED ORDER — FLUCONAZOLE 150 MG PO TABS: 150.0000 mg | ORAL_TABLET | Freq: Once | ORAL | 0 refills | Status: AC

## 2020-10-02 NOTE — Addendum Note (Signed)
Addended by: Felix Pacini A on: 10/02/2020 04:37 PM   Modules accepted: Orders

## 2020-10-02 NOTE — Telephone Encounter (Signed)
-----   Message from [name], DO sent at 10/02/2020  2:43 PM EDT ----- Please inform patient the following information: Pap is normal with negative co test. Rpt pap in 3-5 yrs.

## 2020-10-02 NOTE — Telephone Encounter (Signed)
Certainly.  Please advise her called in Diflucan pill again.

## 2020-10-02 NOTE — Telephone Encounter (Signed)
Spoke with pt regarding labs and instructions. Pt stated that she vomited the fluconazole today when trying to swallow the pill and would like to know if can have medication resent.

## 2020-10-02 NOTE — Telephone Encounter (Signed)
Pt aware.

## 2021-10-10 ENCOUNTER — Other Ambulatory Visit: Payer: Self-pay | Admitting: Family Medicine

## 2021-10-10 ENCOUNTER — Other Ambulatory Visit: Payer: Self-pay

## 2021-10-10 DIAGNOSIS — Z3041 Encounter for surveillance of contraceptive pills: Secondary | ICD-10-CM

## 2021-10-10 MED ORDER — NORGESTIMATE-ETH ESTRADIOL 0.25-35 MG-MCG PO TABS: 1.0000 | ORAL_TABLET | Freq: Every day | ORAL | 0 refills | Status: DC

## 2021-10-31 ENCOUNTER — Ambulatory Visit (INDEPENDENT_AMBULATORY_CARE_PROVIDER_SITE_OTHER): Payer: Self-pay | Admitting: Family Medicine

## 2021-10-31 ENCOUNTER — Encounter: Payer: Self-pay | Admitting: Family Medicine

## 2021-10-31 ENCOUNTER — Other Ambulatory Visit: Payer: Self-pay

## 2021-10-31 DIAGNOSIS — Z3041 Encounter for surveillance of contraceptive pills: Secondary | ICD-10-CM

## 2021-10-31 DIAGNOSIS — Z6841 Body Mass Index (BMI) 40.0 and over, adult: Secondary | ICD-10-CM

## 2021-10-31 MED ORDER — NORGESTIMATE-ETH ESTRADIOL 0.25-35 MG-MCG PO TABS: 1.0000 | ORAL_TABLET | Freq: Every day | ORAL | 12 refills | Status: DC

## 2021-10-31 NOTE — Progress Notes (Signed)
     SUBJECTIVE Chief Complaint  Patient presents with   Contraception    CMC; pt is fasting    HPI: Barbara Mcguire is a 30 y.o. female present to day for refills on BCP  Family planning, BCP (birth control pills) maintenance/obesity : G0P0 female.   Menstrual cycles are routine with use of birth control pill. She is not been sexually active recently.  Last PAP was 09/2020- WNL w/ neg cotest by this provider. Pt would like to reamin on BCP to regulate cycles, w/out she had heavy irregular cycles. Patient's last menstrual period was 10/12/2021.   ROS: See pertinent positives and negatives per HPI.  Patient Active Problem List   Diagnosis Date Noted   Morbid obesity with BMI of 40.0-44.9, adult (HCC) 10/31/2021   Family planning, BCP (birth control pills) maintenance 06/24/2017   Cervical smear, as part of routine gynecological examination 06/24/2017    Social History   Tobacco Use   Smoking status: Never   Smokeless tobacco: Never  Substance Use Topics   Alcohol use: No    Current Outpatient Medications:    cetirizine (ZYRTEC) 10 MG tablet, Take 10 mg by mouth daily as needed., Disp: , Rfl:    norgestimate-ethinyl estradiol (ESTARYLLA) 0.25-35 MG-MCG tablet, Take 1 tablet by mouth daily., Disp: 28 tablet, Rfl: 0  No Known Allergies  OBJECTIVE: BP 129/79   Pulse 78   Temp 98.6 F (37 C) (Oral)   Ht 5\' 2"  (1.575 m)   Wt 245 lb (111.1 kg)   LMP 10/12/2021   SpO2 97%   BMI 44.81 kg/m   Patient's last menstrual period was 10/12/2021. Gen: Afebrile. No acute distress. Obese pleasant female.  HENT: AT. [place]. MMM Eyes:Pupils Equal Round Reactive to light, Extraocular movements intact,  Conjunctiva without redness, discharge or icterus. Neck/lymp/endocrine: Supple,no lymphadenopathy, no thyromegaly CV: RRR no murmur, no edema Chest: CTAB, no wheeze or crackles Neuro: Normal gait. PERLA. EOMi. Alert. Oriented x3 Psych: Normal affect, dress and demeanor. Normal speech.  Normal thought content and judgment.    ASSESSMENT AND PLAN: Barbara Mcguire is a 30 y.o. female present for  Family planning, BCP (birth control pills) maintenance/obesity -Pap UTD- 09/2020 WNL and negative cotest. Rpt pap due 2025-2026. -continue  norgestimate-ethinyl estradiol 0.2 5-3 5 mg-micrograms tablet. - pt declined labs today- self pay.  - f/u yearly. Pt  has been provided resources for HD and residency programs for family planning if desired since self pay.   No orders of the defined types were placed in this encounter.   No orders of the defined types were placed in this encounter.     09-06-2000, DO 10/31/2021

## 2021-10-31 NOTE — Patient Instructions (Signed)
Great to see you today.  I have refilled the medication(s) we provide.   If labs were collected, we will inform you of lab results once received either by echart message or telephone call.   - echart message- for normal results that have been seen by the patient already.   - telephone call: abnormal results or if patient has not viewed results in their echart.  

## 2022-10-31 ENCOUNTER — Encounter: Payer: Self-pay | Admitting: Family Medicine

## 2022-11-05 ENCOUNTER — Other Ambulatory Visit: Payer: Self-pay

## 2022-11-05 DIAGNOSIS — Z3041 Encounter for surveillance of contraceptive pills: Secondary | ICD-10-CM

## 2022-11-14 ENCOUNTER — Encounter: Payer: Self-pay | Admitting: Family Medicine

## 2022-11-18 ENCOUNTER — Other Ambulatory Visit: Payer: Self-pay

## 2022-11-18 DIAGNOSIS — Z3041 Encounter for surveillance of contraceptive pills: Secondary | ICD-10-CM

## 2023-05-08 ENCOUNTER — Ambulatory Visit: Payer: Self-pay | Admitting: Family Medicine

## 2023-05-21 ENCOUNTER — Ambulatory Visit (INDEPENDENT_AMBULATORY_CARE_PROVIDER_SITE_OTHER): Payer: Self-pay | Admitting: Family Medicine

## 2023-05-21 VITALS — BP 126/83 | HR 75 | Temp 98.2°F | Wt 243.0 lb

## 2023-05-21 DIAGNOSIS — Z3041 Encounter for surveillance of contraceptive pills: Secondary | ICD-10-CM

## 2023-05-21 DIAGNOSIS — N920 Excessive and frequent menstruation with regular cycle: Secondary | ICD-10-CM

## 2023-05-21 MED ORDER — MEDROXYPROGESTERONE ACETATE 10 MG PO TABS: 10.0000 mg | ORAL_TABLET | Freq: Two times a day (BID) | ORAL | 0 refills | Status: AC

## 2023-05-21 MED ORDER — NORGESTIMATE-ETH ESTRADIOL 0.25-35 MG-MCG PO TABS: 1.0000 | ORAL_TABLET | Freq: Every day | ORAL | 12 refills | Status: DC

## 2023-05-21 NOTE — Patient Instructions (Addendum)
Return in about 1 year (around 05/21/2024).        Great to see you today.  I have refilled the medication(s) we provide.   If labs were collected, we will inform you of lab results once received either by echart message or telephone call.   - echart message- for normal results that have been seen by the patient already.   - telephone call: abnormal results or if patient has not viewed results in their echart.

## 2023-05-21 NOTE — Progress Notes (Signed)
Barbara Mcguire , [birth year], 32 y.o., female MRN: [id number] Patient Care Team    Relationship Specialty Notifications Start End  [name], DO PCP - General Family Medicine  06/16/16     Chief Complaint  Patient presents with   discuss meds    Pt has been having pretty heavy periods, says it is normal for her if she goes off birth control.     Subjective: Barbara Mcguire is a 32 y.o. Pt presents for an OV with complaints of heavy menstrual cycle. She denies dizziness or fatigue. She reports she had a "Normal"  cycle for 3 mos with 7 d period after she stopped her BCP 10/2022. Her cycle then Skipped feb and march. Since April 17th has had bleeding frequently and last 2 weeks heavy.  She stopped bc she ran out of medications and did not have time to come in to doctor.  She is a self pay patient.  She reports she has always had heavy bleeding when she is not on BCP. She would like to restart med today  Patient's last menstrual period was 05/12/2023.      05/21/2023    8:21 AM 10/31/2021    8:56 AM 10/01/2020    1:31 PM 06/09/2018   11:03 AM 06/16/2016   10:21 AM  Depression screen PHQ 2/9  Decreased Interest 0 0 0 0 0  Down, Depressed, Hopeless 0 0 0 0 0  PHQ - 2 Score 0 0 0 0 0  Altered sleeping 1      Tired, decreased energy 1      Change in appetite 0      Feeling bad or failure about yourself  0      Trouble concentrating 1      Moving slowly or fidgety/restless 0      Suicidal thoughts 0      PHQ-9 Score 3      Difficult doing work/chores Not difficult at all        No Known Allergies Social History   Social History Narrative   Single.    HS education. Works in Engineering geologist.    Drinks caffeine.    Wears seatbelt.   Exercises routinely.    Smoke detector in the home.    Firearms in the home.    Feels safe in her relationships.    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  family with disorders    Blood transfusion without reported diagnosis    Chicken pox    Concussion    Menorrhagia    Subdural hematoma, post-traumatic left   head injury   Past Surgical History:  Procedure Laterality Date   DILATION AND CURETTAGE OF UTERUS     in 9th grade with 2 transfuisions    Family History  Problem Relation Age of Onset   Breast cancer Other    Asthma Mother    Hypertension Mother    Ovarian cancer Mother    HIV Father    Hemophilia Father    Pancreatic cancer Maternal Grandmother    Arthritis Maternal Grandfather    Heart disease Maternal Grandfather    Diabetes Paternal Grandfather    Von Willebrand disease Paternal Grandfather    Heart disease Paternal Grandmother    Allergies as of 05/21/2023   No Known Allergies  Medication List        Accurate as of May 21, 2023  9:30 AM. If you have any questions, ask your nurse or doctor.          cetirizine 10 MG tablet Commonly known as: ZYRTEC Take 10 mg by mouth daily as needed.   medroxyPROGESTERone 10 MG tablet Commonly known as: Provera Take 1 tablet (10 mg total) by mouth 2 (two) times daily. Started by: Felix Pacini, DO   norgestimate-ethinyl estradiol 0.25-35 MG-MCG tablet Commonly known as: Estarylla Take 1 tablet by mouth daily.        All past medical history, surgical history, allergies, family history, immunizations andmedications were updated in the EMR today and reviewed under the history and medication portions of their EMR.     ROS Negative, with the exception of above mentioned in HPI   Objective:  BP 126/83   Pulse 75   Temp 98.2 F (36.8 C)   Wt 243 lb (110.2 kg)   LMP 05/12/2023   SpO2 99%   BMI 44.45 kg/m  Body mass index is 44.45 kg/m. Physical Exam Vitals and nursing note reviewed.  Constitutional:      General: She is not in acute distress.    Appearance: Normal appearance. She is not ill-appearing, toxic-appearing or diaphoretic.  HENT:      Head: Normocephalic and atraumatic.  Eyes:     General: No scleral icterus.       Right eye: No discharge.        Left eye: No discharge.     Extraocular Movements: Extraocular movements intact.     Conjunctiva/sclera: Conjunctivae normal.     Pupils: Pupils are equal, round, and reactive to light.  Cardiovascular:     Rate and Rhythm: Normal rate and regular rhythm.  Pulmonary:     Effort: Pulmonary effort is normal. No respiratory distress.     Breath sounds: Normal breath sounds. No wheezing, rhonchi or rales.  Musculoskeletal:     Right lower leg: No edema.     Left lower leg: No edema.  Skin:    General: Skin is warm.     Findings: No rash.  Neurological:     Mental Status: She is alert and oriented to person, place, and time. Mental status is at baseline.     Motor: No weakness.     Gait: Gait normal.  Psychiatric:        Mood and Affect: Mood normal.        Behavior: Behavior normal.        Thought Content: Thought content normal.        Judgment: Judgment normal.     No results found. No results found. No results found for this or any previous visit (from the past 24 hour(s)).  Assessment/Plan: [name] is a 32 y.o. female present for OV for  Family planning, BCP (birth control pills) maintenance/menorrhagia with regular cycle Progesterone x5 days, then start BCP  She is symptomatic- no labs needed (esp on self pay) Encouraged OTC iron for a month w/ stool softener.  - norgestimate-ethinyl estradiol (ESTARYLLA) 0.25-35 MG-MCG tablet; Take 1 tablet by mouth daily.  Dispense: 28 tablet; Refill: 12 F/u 3 mos if cycles are not regulated, sooner if needed. Otherwise, yearly appt needed.  Reviewed expectations re: course of current medical issues. Discussed self-management of symptoms. Outlined signs and symptoms indicating need for more acute intervention. Patient verbalized understanding and all questions were answered. Patient received an After-Visit  Summary.    No orders of the defined types were placed in this encounter.  Meds ordered this encounter  Medications   norgestimate-ethinyl estradiol (ESTARYLLA) 0.25-35 MG-MCG tablet    Sig: Take 1 tablet by mouth daily.    Dispense:  28 tablet    Refill:  12   medroxyPROGESTERone (PROVERA) 10 MG tablet    Sig: Take 1 tablet (10 mg total) by mouth 2 (two) times daily.    Dispense:  10 tablet    Refill:  0   Referral Orders  No referral(s) requested today     Note is dictated utilizing voice recognition software. Although note has been proof read prior to signing, occasional typographical errors still can be missed. If any questions arise, please do not hesitate to call for verification.   electronically signed by:  Felix Pacini, DO  [facility] - [place]

## 2024-04-27 ENCOUNTER — Encounter: Payer: Self-pay | Admitting: Family Medicine

## 2024-04-27 ENCOUNTER — Other Ambulatory Visit: Payer: Self-pay

## 2024-04-27 DIAGNOSIS — Z3041 Encounter for surveillance of contraceptive pills: Secondary | ICD-10-CM

## 2024-04-27 MED ORDER — NORGESTIMATE-ETH ESTRADIOL 0.25-35 MG-MCG PO TABS: 1.0000 | ORAL_TABLET | Freq: Every day | ORAL | 0 refills | Status: DC

## 2024-05-26 ENCOUNTER — Encounter: Payer: Self-pay | Admitting: Family Medicine

## 2024-06-03 ENCOUNTER — Encounter: Payer: Self-pay | Admitting: Family Medicine

## 2024-06-03 ENCOUNTER — Ambulatory Visit (INDEPENDENT_AMBULATORY_CARE_PROVIDER_SITE_OTHER): Payer: Self-pay | Admitting: Family Medicine

## 2024-06-03 DIAGNOSIS — Z3041 Encounter for surveillance of contraceptive pills: Secondary | ICD-10-CM

## 2024-06-03 MED ORDER — NORGESTIMATE-ETH ESTRADIOL 0.25-35 MG-MCG PO TABS: 1.0000 | ORAL_TABLET | Freq: Every day | ORAL | 11 refills | Status: AC

## 2024-06-03 NOTE — Patient Instructions (Addendum)
 Return in about 1 year (around 06/04/2025) for Routine chronic condition follow-up, Routine chronic condition follow-up (40 min) with pap.        Great to see you today.  I have refilled the medication(s) we provide.   If labs were collected or images ordered, we will inform you of  results once we have received them and reviewed. We will contact you either by echart message, or telephone call.  Please give ample time to the testing facility, and our office to run,  receive and review results. Please do not call inquiring of results, even if you can see them in your chart. We will contact you as soon as we are able. If it has been over 1 week since the test was completed, and you have not yet heard from us , then please call us .    - echart message- for normal results that have been seen by the patient already.   - telephone call: abnormal results or if patient has not viewed results in their echart.  If a referral to a specialist was entered for you, please call us  in 2 weeks if you have not heard from the specialist office to schedule.

## 2024-06-03 NOTE — Progress Notes (Signed)
 Barbara Mcguire , Dec 27, 1991, 33 y.o., female MRN: [id number] Patient Care Team    Relationship Specialty Notifications Start End  Mariel Shope, DO PCP - General Family Medicine  06/16/16     Chief Complaint  Patient presents with   Menorrhagia     Subjective: Barbara Mcguire is a 33 y.o. Pt presents for an OV BCP maintenance. Compliant with estarylla. Period 2-3 days and light now.  Pap UTD 09/2020 Prior note: with complaints of heavy menstrual cycle. She denies dizziness or fatigue. She reports she had a "Normal"  cycle for 3 mos with 7 d period after she stopped her BCP 10/2022. Her cycle then Skipped feb and march. Since April 17th has had bleeding frequently and last 2 weeks heavy.  She stopped bc she ran out of medications and did not have time to come in to doctor.  She is a self pay patient.  She reports she has always had heavy bleeding when she is not on BCP.   Patient's last menstrual period was 05/26/2024.      05/21/2023    8:21 AM 10/31/2021    8:56 AM 10/01/2020    1:31 PM 06/09/2018   11:03 AM 06/16/2016   10:21 AM  Depression screen PHQ 2/9  Decreased Interest 0 0 0 0 0  Down, Depressed, Hopeless 0 0 0 0 0  PHQ - 2 Score 0 0 0 0 0  Altered sleeping 1      Tired, decreased energy 1      Change in appetite 0      Feeling bad or failure about yourself  0      Trouble concentrating 1      Moving slowly or fidgety/restless 0      Suicidal thoughts 0      PHQ-9 Score 3      Difficult doing work/chores Not difficult at all        No Known Allergies Social History   Social History Narrative   Single.    HS education. Works in Engineering geologist.    Drinks caffeine.    Wears seatbelt.   Exercises routinely.    Smoke detector in the home.    Firearms in the home.    Feels safe in her relationships.    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    Blood  transfusion without reported diagnosis    Chicken pox    Concussion    Menorrhagia    Subdural hematoma, post-traumatic (HCC) left   head injury   Past Surgical History:  Procedure Laterality Date   DILATION AND CURETTAGE OF UTERUS     in 9th grade with 2 transfuisions    Family History  Problem Relation Age of Onset   Breast cancer Other    Asthma Mother    Hypertension Mother    Ovarian cancer Mother    HIV Father    Hemophilia Father    Pancreatic cancer Maternal Grandmother    Arthritis Maternal Grandfather    Heart disease Maternal Grandfather    Diabetes Paternal Grandfather    Von Willebrand disease Paternal Grandfather    Heart disease Paternal Grandmother    Allergies as of 06/03/2024   No Known Allergies      Medication List        Accurate as  of June 03, 2024  9:13 AM. If you have any questions, ask your nurse or doctor.          cetirizine 10 MG tablet Commonly known as: ZYRTEC Take 10 mg by mouth daily as needed.   medroxyPROGESTERone  10 MG tablet Commonly known as: Provera  Take 1 tablet (10 mg total) by mouth 2 (two) times daily.   norgestimate -ethinyl estradiol  0.25-35 MG-MCG tablet Commonly known as: Estarylla Take 1 tablet by mouth daily.        All past medical history, surgical history, allergies, family history, immunizations andmedications were updated in the EMR today and reviewed under the history and medication portions of their EMR.     ROS Negative, with the exception of above mentioned in HPI   Objective:  BP 122/76   Pulse 90   Temp 98.1 F (36.7 C)   Wt 252 lb (114.3 kg)   LMP 05/26/2024   SpO2 99%   BMI 46.09 kg/m  Body mass index is 46.09 kg/m. Physical Exam Vitals and nursing note reviewed.  Constitutional:      General: She is not in acute distress.    Appearance: Normal appearance. She is not ill-appearing, toxic-appearing or diaphoretic.  HENT:     Head: Normocephalic and atraumatic.  Eyes:      General: No scleral icterus.       Right eye: No discharge.        Left eye: No discharge.     Extraocular Movements: Extraocular movements intact.     Conjunctiva/sclera: Conjunctivae normal.     Pupils: Pupils are equal, round, and reactive to light.  Neck:     Comments: No thyromegaly Cardiovascular:     Rate and Rhythm: Normal rate and regular rhythm.  Pulmonary:     Effort: Pulmonary effort is normal. No respiratory distress.     Breath sounds: Normal breath sounds. No wheezing, rhonchi or rales.  Musculoskeletal:     Cervical back: Neck supple.     Right lower leg: No edema.     Left lower leg: No edema.  Lymphadenopathy:     Cervical: No cervical adenopathy.  Skin:    General: Skin is warm.     Findings: No rash.  Neurological:     Mental Status: She is alert and oriented to person, place, and time. Mental status is at baseline.     Motor: No weakness.     Gait: Gait normal.  Psychiatric:        Mood and Affect: Mood normal.        Behavior: Behavior normal.        Thought Content: Thought content normal.        Judgment: Judgment normal.     No results found. No results found. No results found for this or any previous visit (from the past 24 hours).  Assessment/Plan: Barbara Mcguire is a 33 y.o. female present for OV for  Family planning, BCP (birth control pills) maintenance/menorrhagia with regular cycle Stable Cycles have returned to light cycles.  No complaints PAP due next year MAM at 39 - norgestimate -ethinyl estradiol  (ESTARYLLA) 0.25-35 MG-MCG tablet; Take 1 tablet by mouth daily.  Dispense: 28 tablet; Refill: 12  Return in about 1 year (around 06/04/2025) for Routine chronic condition follow-up, Routine chronic condition follow-up (40 min) with pap.   Reviewed expectations re: course of current medical issues. Discussed self-management of symptoms. Outlined signs and symptoms indicating need for more acute intervention. Patient verbalized  understanding and  all questions were answered. Patient received an After-Visit Summary.    No orders of the defined types were placed in this encounter.  Meds ordered this encounter  Medications   norgestimate -ethinyl estradiol  (ESTARYLLA) 0.25-35 MG-MCG tablet    Sig: Take 1 tablet by mouth daily.    Dispense:  28 tablet    Refill:  11   Referral Orders  No referral(s) requested today     Note is dictated utilizing voice recognition software. Although note has been proof read prior to signing, occasional typographical errors still can be missed. If any questions arise, please do not hesitate to call for verification.   electronically signed by:  Napolean Backbone, DO  [facility] [place]

## 2025-06-05 ENCOUNTER — Encounter: Payer: Self-pay | Admitting: Family Medicine
